# Patient Record
Sex: Male | Born: 1950 | Race: White | Hispanic: No | Marital: Married | Smoking: Former smoker
Health system: Southern US, Community
[De-identification: ages and names within clinical notes are randomized; demographics above are authoritative.]

## PROBLEM LIST (undated history)

## (undated) DIAGNOSIS — E785 Hyperlipidemia, unspecified: Secondary | ICD-10-CM

## (undated) DIAGNOSIS — I219 Acute myocardial infarction, unspecified: Secondary | ICD-10-CM

## (undated) DIAGNOSIS — IMO0002 Reserved for concepts with insufficient information to code with codable children: Secondary | ICD-10-CM

## (undated) DIAGNOSIS — I10 Essential (primary) hypertension: Secondary | ICD-10-CM

## (undated) DIAGNOSIS — E119 Type 2 diabetes mellitus without complications: Secondary | ICD-10-CM

## (undated) DIAGNOSIS — K219 Gastro-esophageal reflux disease without esophagitis: Secondary | ICD-10-CM

## (undated) DIAGNOSIS — E11319 Type 2 diabetes mellitus with unspecified diabetic retinopathy without macular edema: Secondary | ICD-10-CM

## (undated) DIAGNOSIS — I2510 Atherosclerotic heart disease of native coronary artery without angina pectoris: Secondary | ICD-10-CM

## (undated) DIAGNOSIS — M4800 Spinal stenosis, site unspecified: Secondary | ICD-10-CM

## (undated) DIAGNOSIS — M1990 Unspecified osteoarthritis, unspecified site: Secondary | ICD-10-CM

## (undated) HISTORY — PX: CORONARY ARTERY BYPASS GRAFT: SHX141

## (undated) HISTORY — PX: CERVICAL SPINE SURGERY: SHX589

---

## 2000-07-08 ENCOUNTER — Encounter: Payer: Self-pay | Admitting: Neurosurgery

## 2000-07-09 ENCOUNTER — Inpatient Hospital Stay (HOSPITAL_COMMUNITY): Admission: RE | Admit: 2000-07-09 | Discharge: 2000-07-10 | Payer: Self-pay | Admitting: Neurosurgery

## 2000-07-09 ENCOUNTER — Encounter: Payer: Self-pay | Admitting: Neurosurgery

## 2011-12-15 HISTORY — PX: INGUINAL HERNIA REPAIR: SUR1180

## 2013-08-25 ENCOUNTER — Other Ambulatory Visit: Payer: Self-pay | Admitting: Neurosurgery

## 2013-09-07 ENCOUNTER — Encounter (HOSPITAL_COMMUNITY): Payer: Self-pay | Admitting: Pharmacy Technician

## 2013-09-09 NOTE — Pre-Procedure Instructions (Signed)
Christian Edwards  09/09/2013   Your procedure is scheduled on:  October 3  Report to [facility] Entrance "A" [address] at [employer] AM.  Call this number if you have problems the morning of surgery: [phone]   Remember:   Do not eat food or drink liquids after midnight.   Take these medicines the morning of surgery with A SIP OF WATER: Atenolol, Wellbutrin, Nexium, Isosorbide, Tramadol (if needed)   STOP Naproxen today  Do not take Aspirin, Aleve, Naproxen, Advil, Ibuprofen, Vitamin, Herbs, or Supplements starting today  Do not wear jewelry, make-up or nail polish.  Do not wear lotions, powders, or perfumes. You may wear deodorant.  Do not shave 48 hours prior to surgery. Men may shave face and neck.  Do not bring valuables to the hospital.  [facility] is not responsible                  for any belongings or valuables.               Contacts, dentures or bridgework may not be worn into surgery.  Leave suitcase in the car. After surgery it may be brought to your room.  For patients admitted to the hospital, discharge time is determined by your                treatment team.               Patients discharged the day of surgery will not be allowed to drive  home.  Name and phone number of your driver: Family/ Friend  Special Instructions: Shower using CHG 2 nights before surgery and the night before surgery.  If you shower the day of surgery use CHG.  Use special wash - you have one bottle of CHG for all showers.  You should use approximately 1/3 of the bottle for each shower.   Please read over the following fact sheets that you were given: Pain Booklet, Coughing and Deep Breathing and Surgical Site Infection Prevention

## 2013-09-11 ENCOUNTER — Encounter (HOSPITAL_COMMUNITY): Payer: Self-pay

## 2013-09-11 ENCOUNTER — Ambulatory Visit (HOSPITAL_COMMUNITY)
Admission: RE | Admit: 2013-09-11 | Discharge: 2013-09-11 | Disposition: A | Discharge status: Home / Self Care | Payer: No Typology Code available for payment source | Source: Ambulatory Visit | Attending: Anesthesiology | Admitting: Anesthesiology

## 2013-09-11 ENCOUNTER — Encounter (HOSPITAL_COMMUNITY)
Admission: RE | Admit: 2013-09-11 | Discharge: 2013-09-11 | Disposition: A | Discharge status: Home / Self Care | Payer: No Typology Code available for payment source | Source: Ambulatory Visit | Attending: Neurosurgery | Admitting: Neurosurgery

## 2013-09-11 DIAGNOSIS — Z01818 Encounter for other preprocedural examination: Secondary | ICD-10-CM | POA: Insufficient documentation

## 2013-09-11 DIAGNOSIS — Z01812 Encounter for preprocedural laboratory examination: Secondary | ICD-10-CM | POA: Insufficient documentation

## 2013-09-11 HISTORY — DX: Unspecified osteoarthritis, unspecified site: M19.90

## 2013-09-11 HISTORY — DX: Spinal stenosis, site unspecified: M48.00

## 2013-09-11 HISTORY — DX: Essential (primary) hypertension: I10

## 2013-09-11 HISTORY — DX: Acute myocardial infarction, unspecified: I21.9

## 2013-09-11 HISTORY — DX: Hyperlipidemia, unspecified: E78.5

## 2013-09-11 HISTORY — DX: Atherosclerotic heart disease of native coronary artery without angina pectoris: I25.10

## 2013-09-11 HISTORY — DX: Gastro-esophageal reflux disease without esophagitis: K21.9

## 2013-09-11 HISTORY — DX: Type 2 diabetes mellitus without complications: E11.9

## 2013-09-11 HISTORY — DX: Reserved for concepts with insufficient information to code with codable children: IMO0002

## 2013-09-11 HISTORY — DX: Type 2 diabetes mellitus with unspecified diabetic retinopathy without macular edema: E11.319

## 2013-09-11 LAB — BASIC METABOLIC PANEL
CO2: 28 mEq/L (ref 19–32)
Calcium: 10.1 mg/dL (ref 8.4–10.5)
Creatinine, Ser: 1.11 mg/dL (ref 0.50–1.35)
GFR calc non Af Amer: 70 mL/min — ABNORMAL LOW (ref >90)
Glucose, Bld: 154 mg/dL — ABNORMAL HIGH (ref 70–99)
Sodium: 137 mEq/L (ref 135–145)

## 2013-09-11 LAB — CBC
MCH: 31.9 pg (ref 26.0–34.0)
MCV: 88.5 fL (ref 78.0–100.0)
Platelets: 251 10*3/uL (ref 150–400)
RBC: 4.33 MIL/uL (ref 4.22–5.81)
RDW: 13.1 % (ref 11.5–15.5)
WBC: 11.5 10*3/uL — ABNORMAL HIGH (ref 4.0–10.5)

## 2013-09-11 LAB — SURGICAL PCR SCREEN
MRSA, PCR: NEGATIVE
Staphylococcus aureus: NEGATIVE

## 2013-09-11 NOTE — Progress Notes (Signed)
09/11/13 0826  OBSTRUCTIVE SLEEP APNEA  Have you ever been diagnosed with sleep apnea through a sleep study? No  Do you snore loudly (loud enough to be heard through closed doors)?  1  Do you often feel tired, fatigued, or sleepy during the daytime? 1  Has anyone observed you stop breathing during your sleep? 0  Do you have, or are you being treated for high blood pressure? 1  BMI more than 35 kg/m2? 0  Age over 62 years old? 1  Neck circumference greater than 40 cm/18 inches? 0  Gender: 1  Obstructive Sleep Apnea Score 5  Score 4 or greater  Results sent to PCP

## 2013-09-12 NOTE — Progress Notes (Signed)
Anesthesia Chart Review:  Patient is a 62 year old male scheduled for left L2-3 laminectomy on 09/15/13 by Dr. Wynetta Emery. 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 OSA screening score was 5. Cardiologist is Dr. Chales Abrahams with [facility].  Patient was asymptomatic with a normal EKG, so he was cleared for this procedure from a cardiac standpoint.  He has not had any recent cardiac studies within the past three years at [facility] or [facility].     EKG on 08/15/13 showed NSR, consider old anterior infarct.   Preoperative labs and CXR noted.  Patient has been cleared by cardiology.  Preoperative testing appears acceptable.  Anticipate that he can proceed as planned.  Velna Ochs [facility] Short Stay Center/Anesthesiology Phone [phone] 09/12/2013 10:41 AM

## 2013-09-14 MED ORDER — CEFAZOLIN SODIUM-DEXTROSE 2-3 GM-% IV SOLR
2.0000 g | INTRAVENOUS | Status: AC
  Administered 2013-09-15: 2 g via INTRAVENOUS
  Filled 2013-09-14: qty 50

## 2013-09-14 MED ORDER — DEXAMETHASONE SODIUM PHOSPHATE 10 MG/ML IJ SOLN
10.0000 mg | INTRAMUSCULAR | Status: AC
  Administered 2013-09-15: 10 mg via INTRAVENOUS
  Filled 2013-09-14: qty 1

## 2013-09-15 ENCOUNTER — Encounter (HOSPITAL_COMMUNITY)
Admission: RE | Disposition: A | Discharge status: Home / Self Care | Payer: Self-pay | Source: Ambulatory Visit | Attending: Neurosurgery

## 2013-09-15 ENCOUNTER — Encounter (HOSPITAL_COMMUNITY): Payer: Self-pay | Admitting: Surgery

## 2013-09-15 ENCOUNTER — Observation Stay (HOSPITAL_COMMUNITY)
Admission: RE | Admit: 2013-09-15 | Discharge: 2013-09-15 | Disposition: A | Discharge status: Home / Self Care | Payer: No Typology Code available for payment source | Source: Ambulatory Visit | Attending: Neurosurgery | Admitting: Neurosurgery

## 2013-09-15 ENCOUNTER — Encounter (HOSPITAL_COMMUNITY): Payer: Self-pay | Admitting: Vascular Surgery

## 2013-09-15 ENCOUNTER — Inpatient Hospital Stay (HOSPITAL_COMMUNITY): Payer: No Typology Code available for payment source | Admitting: Certified Registered"

## 2013-09-15 ENCOUNTER — Observation Stay (HOSPITAL_COMMUNITY): Payer: No Typology Code available for payment source

## 2013-09-15 DIAGNOSIS — I2510 Atherosclerotic heart disease of native coronary artery without angina pectoris: Secondary | ICD-10-CM | POA: Insufficient documentation

## 2013-09-15 DIAGNOSIS — E785 Hyperlipidemia, unspecified: Secondary | ICD-10-CM | POA: Insufficient documentation

## 2013-09-15 DIAGNOSIS — I10 Essential (primary) hypertension: Secondary | ICD-10-CM | POA: Insufficient documentation

## 2013-09-15 DIAGNOSIS — M5126 Other intervertebral disc displacement, lumbar region: Principal | ICD-10-CM | POA: Insufficient documentation

## 2013-09-15 DIAGNOSIS — K219 Gastro-esophageal reflux disease without esophagitis: Secondary | ICD-10-CM | POA: Insufficient documentation

## 2013-09-15 DIAGNOSIS — E1139 Type 2 diabetes mellitus with other diabetic ophthalmic complication: Secondary | ICD-10-CM | POA: Insufficient documentation

## 2013-09-15 DIAGNOSIS — E11319 Type 2 diabetes mellitus with unspecified diabetic retinopathy without macular edema: Secondary | ICD-10-CM | POA: Insufficient documentation

## 2013-09-15 DIAGNOSIS — I252 Old myocardial infarction: Secondary | ICD-10-CM | POA: Insufficient documentation

## 2013-09-15 HISTORY — PX: LUMBAR LAMINECTOMY/DECOMPRESSION MICRODISCECTOMY: SHX5026

## 2013-09-15 LAB — GLUCOSE, CAPILLARY
Glucose-Capillary: 69 mg/dL — ABNORMAL LOW (ref 70–99)
Glucose-Capillary: 135 mg/dL — ABNORMAL HIGH (ref 70–99)

## 2013-09-15 SURGERY — LUMBAR LAMINECTOMY/DECOMPRESSION MICRODISCECTOMY 1 LEVEL
Anesthesia: General | Site: Back | Laterality: Left | Wound class: Clean

## 2013-09-15 MED ORDER — OXYCODONE-ACETAMINOPHEN 5-325 MG PO TABS
1.0000 | ORAL_TABLET | ORAL | Status: DC | PRN
  Administered 2013-09-15: 1 via ORAL
  Filled 2013-09-15: qty 1

## 2013-09-15 MED ORDER — NAPROXEN SODIUM 220 MG PO TABS: 220.0000 mg | ORAL_TABLET | Freq: Two times a day (BID) | ORAL | Status: DC | PRN

## 2013-09-15 MED ORDER — MENTHOL 3 MG MT LOZG: 1.0000 | LOZENGE | OROMUCOSAL | Status: DC | PRN

## 2013-09-15 MED ORDER — MEPERIDINE HCL 25 MG/ML IJ SOLN: 6.2500 mg | INTRAMUSCULAR | Status: DC | PRN

## 2013-09-15 MED ORDER — PHENOL 1.4 % MT LIQD: 1.0000 | OROMUCOSAL | Status: DC | PRN

## 2013-09-15 MED ORDER — SODIUM CHLORIDE 0.9 % IJ SOLN: 3.0000 mL | INTRAMUSCULAR | Status: DC | PRN

## 2013-09-15 MED ORDER — PHENYLEPHRINE HCL 10 MG/ML IJ SOLN
INTRAMUSCULAR | Status: DC | PRN
  Administered 2013-09-15: 40 ug via INTRAVENOUS

## 2013-09-15 MED ORDER — ROCURONIUM BROMIDE 100 MG/10ML IV SOLN
INTRAVENOUS | Status: DC | PRN
  Administered 2013-09-15: 50 mg via INTRAVENOUS

## 2013-09-15 MED ORDER — SODIUM CHLORIDE 0.9 % IJ SOLN: 3.0000 mL | Freq: Two times a day (BID) | INTRAMUSCULAR | Status: DC

## 2013-09-15 MED ORDER — 0.9 % SODIUM CHLORIDE (POUR BTL) OPTIME
TOPICAL | Status: DC | PRN
  Administered 2013-09-15: 1000 mL

## 2013-09-15 MED ORDER — LIDOCAINE-EPINEPHRINE 1 %-1:100000 IJ SOLN
INTRAMUSCULAR | Status: DC | PRN
  Administered 2013-09-15: 10 mL

## 2013-09-15 MED ORDER — ACETAMINOPHEN 325 MG PO TABS: 650.0000 mg | ORAL_TABLET | ORAL | Status: DC | PRN

## 2013-09-15 MED ORDER — ATORVASTATIN CALCIUM 10 MG PO TABS
10.0000 mg | ORAL_TABLET | Freq: Every day | ORAL | Status: DC
  Filled 2013-09-15: qty 1

## 2013-09-15 MED ORDER — ATENOLOL 50 MG PO TABS
50.0000 mg | ORAL_TABLET | Freq: Two times a day (BID) | ORAL | Status: DC
  Filled 2013-09-15: qty 1

## 2013-09-15 MED ORDER — DOCUSATE SODIUM 100 MG PO CAPS: 100.0000 mg | ORAL_CAPSULE | Freq: Two times a day (BID) | ORAL | Status: DC

## 2013-09-15 MED ORDER — BUPIVACAINE HCL (PF) 0.25 % IJ SOLN
INTRAMUSCULAR | Status: DC | PRN
  Administered 2013-09-15: 10 mL

## 2013-09-15 MED ORDER — HYDROMORPHONE HCL PF 1 MG/ML IJ SOLN: 0.2500 mg | INTRAMUSCULAR | Status: DC | PRN

## 2013-09-15 MED ORDER — THROMBIN 5000 UNITS EX SOLR
CUTANEOUS | Status: DC | PRN
  Administered 2013-09-15: 5000 [IU] via TOPICAL

## 2013-09-15 MED ORDER — OXYCODONE HCL 5 MG PO TABS: 5.0000 mg | ORAL_TABLET | Freq: Once | ORAL | Status: DC | PRN

## 2013-09-15 MED ORDER — TRAMADOL HCL 50 MG PO TABS: 50.0000 mg | ORAL_TABLET | Freq: Three times a day (TID) | ORAL | Status: DC | PRN

## 2013-09-15 MED ORDER — LIDOCAINE HCL (CARDIAC) 20 MG/ML IV SOLN
INTRAVENOUS | Status: DC | PRN
  Administered 2013-09-15: 80 mg via INTRAVENOUS

## 2013-09-15 MED ORDER — SODIUM CHLORIDE 0.9 % IR SOLN
Status: DC | PRN
  Administered 2013-09-15: 08:00:00

## 2013-09-15 MED ORDER — INFLUENZA VAC SPLIT QUAD 0.5 ML IM SUSP: 0.5000 mL | INTRAMUSCULAR | Status: DC

## 2013-09-15 MED ORDER — ALUM & MAG HYDROXIDE-SIMETH 200-200-20 MG/5ML PO SUSP: 30.0000 mL | Freq: Four times a day (QID) | ORAL | Status: DC | PRN

## 2013-09-15 MED ORDER — NAPROXEN 250 MG PO TABS
250.0000 mg | ORAL_TABLET | Freq: Two times a day (BID) | ORAL | Status: DC | PRN
  Filled 2013-09-15: qty 1

## 2013-09-15 MED ORDER — ONDANSETRON HCL 4 MG/2ML IJ SOLN: 4.0000 mg | INTRAMUSCULAR | Status: DC | PRN

## 2013-09-15 MED ORDER — OXYCODONE-ACETAMINOPHEN 5-325 MG PO TABS: 1.0000 | ORAL_TABLET | ORAL | Status: AC | PRN

## 2013-09-15 MED ORDER — INSULIN GLARGINE 100 UNIT/ML ~~LOC~~ SOLN
20.0000 [IU] | Freq: Every day | SUBCUTANEOUS | Status: DC
  Filled 2013-09-15: qty 0.2

## 2013-09-15 MED ORDER — ONDANSETRON HCL 4 MG/2ML IJ SOLN
INTRAMUSCULAR | Status: DC | PRN
  Administered 2013-09-15: 4 mg via INTRAVENOUS

## 2013-09-15 MED ORDER — DEXTROSE 50 % IV SOLN
INTRAVENOUS | Status: AC
  Administered 2013-09-15: 12.5 g via INTRAVENOUS
  Filled 2013-09-15: qty 50

## 2013-09-15 MED ORDER — NEOSTIGMINE METHYLSULFATE 1 MG/ML IJ SOLN
INTRAMUSCULAR | Status: DC | PRN
  Administered 2013-09-15: 1 mg via INTRAVENOUS
  Administered 2013-09-15: 3 mg via INTRAVENOUS

## 2013-09-15 MED ORDER — INSULIN GLARGINE 100 UNITS/ML SOLOSTAR PEN: 20.0000 [IU] | PEN_INJECTOR | Freq: Every day | SUBCUTANEOUS | Status: DC

## 2013-09-15 MED ORDER — OXYCODONE HCL 5 MG/5ML PO SOLN: 5.0000 mg | Freq: Once | ORAL | Status: DC | PRN

## 2013-09-15 MED ORDER — PROPOFOL 10 MG/ML IV BOLUS
INTRAVENOUS | Status: DC | PRN
  Administered 2013-09-15: 80 mg via INTRAVENOUS

## 2013-09-15 MED ORDER — FENTANYL CITRATE 0.05 MG/ML IJ SOLN
INTRAMUSCULAR | Status: DC | PRN
  Administered 2013-09-15: 250 ug via INTRAVENOUS

## 2013-09-15 MED ORDER — PROMETHAZINE HCL 25 MG/ML IJ SOLN: 6.2500 mg | INTRAMUSCULAR | Status: DC | PRN

## 2013-09-15 MED ORDER — LACTATED RINGERS IV SOLN
INTRAVENOUS | Status: DC | PRN
  Administered 2013-09-15: 07:00:00 via INTRAVENOUS

## 2013-09-15 MED ORDER — PANTOPRAZOLE SODIUM 40 MG PO TBEC: 40.0000 mg | DELAYED_RELEASE_TABLET | Freq: Every day | ORAL | Status: DC

## 2013-09-15 MED ORDER — ACETAMINOPHEN 650 MG RE SUPP: 650.0000 mg | RECTAL | Status: DC | PRN

## 2013-09-15 MED ORDER — LACTATED RINGERS IV SOLN
INTRAVENOUS | Status: DC
  Administered 2013-09-15: 07:00:00 via INTRAVENOUS

## 2013-09-15 MED ORDER — CEFAZOLIN SODIUM 1-5 GM-% IV SOLN
1.0000 g | Freq: Three times a day (TID) | INTRAVENOUS | Status: DC
  Filled 2013-09-15 (×2): qty 50

## 2013-09-15 MED ORDER — CYCLOBENZAPRINE HCL 10 MG PO TABS: 10.0000 mg | ORAL_TABLET | Freq: Three times a day (TID) | ORAL | Status: AC | PRN

## 2013-09-15 MED ORDER — MIDAZOLAM HCL 5 MG/5ML IJ SOLN
INTRAMUSCULAR | Status: DC | PRN
  Administered 2013-09-15: 1 mg via INTRAVENOUS

## 2013-09-15 MED ORDER — ISOSORBIDE MONONITRATE ER 60 MG PO TB24
120.0000 mg | ORAL_TABLET | Freq: Every day | ORAL | Status: DC
  Filled 2013-09-15: qty 2

## 2013-09-15 MED ORDER — GLYCOPYRROLATE 0.2 MG/ML IJ SOLN
INTRAMUSCULAR | Status: DC | PRN
  Administered 2013-09-15: 0.4 mg via INTRAVENOUS
  Administered 2013-09-15: 0.2 mg via INTRAVENOUS

## 2013-09-15 MED ORDER — HEMOSTATIC AGENTS (NO CHARGE) OPTIME
TOPICAL | Status: DC | PRN
  Administered 2013-09-15: 1 via TOPICAL

## 2013-09-15 MED ORDER — SODIUM CHLORIDE 0.9 % IV SOLN: 250.0000 mL | INTRAVENOUS | Status: DC

## 2013-09-15 MED ORDER — MIDAZOLAM HCL 2 MG/2ML IJ SOLN: 0.5000 mg | Freq: Once | INTRAMUSCULAR | Status: DC | PRN

## 2013-09-15 MED ORDER — BUPROPION HCL ER (XL) 150 MG PO TB24
150.0000 mg | ORAL_TABLET | Freq: Every day | ORAL | Status: DC
  Filled 2013-09-15: qty 1

## 2013-09-15 MED ORDER — HYDROMORPHONE HCL PF 1 MG/ML IJ SOLN: 0.5000 mg | INTRAMUSCULAR | Status: DC | PRN

## 2013-09-15 MED ORDER — LISINOPRIL 5 MG PO TABS
5.0000 mg | ORAL_TABLET | Freq: Every day | ORAL | Status: DC
  Filled 2013-09-15: qty 1

## 2013-09-15 MED ORDER — GABAPENTIN 300 MG PO CAPS
300.0000 mg | ORAL_CAPSULE | Freq: Every day | ORAL | Status: DC
  Filled 2013-09-15: qty 1

## 2013-09-15 MED ORDER — METFORMIN HCL 500 MG PO TABS
1000.0000 mg | ORAL_TABLET | Freq: Every day | ORAL | Status: DC
  Filled 2013-09-15: qty 2

## 2013-09-15 MED ORDER — CYCLOBENZAPRINE HCL 10 MG PO TABS: 10.0000 mg | ORAL_TABLET | Freq: Three times a day (TID) | ORAL | Status: DC | PRN

## 2013-09-15 SURGICAL SUPPLY — 60 items
ADH SKN CLS APL DERMABOND .7 (GAUZE/BANDAGES/DRESSINGS) ×1
ADH SKN CLS LQ APL DERMABOND (GAUZE/BANDAGES/DRESSINGS) ×1
APL SKNCLS STERI-STRIP NONHPOA (GAUZE/BANDAGES/DRESSINGS) ×1
BAG DECANTER FOR FLEXI CONT (MISCELLANEOUS) ×2 IMPLANT
BENZOIN TINCTURE PRP APPL 2/3 (GAUZE/BANDAGES/DRESSINGS) ×2 IMPLANT
BLADE SURG 11 STRL SS (BLADE) ×2 IMPLANT
BLADE SURG ROTATE 9660 (MISCELLANEOUS) IMPLANT
BRUSH SCRUB EZ PLAIN DRY (MISCELLANEOUS) ×2 IMPLANT
BUR MATCHSTICK NEURO 3.0 LAGG (BURR) ×2 IMPLANT
BUR PRECISION FLUTE 6.0 (BURR) ×2 IMPLANT
CANISTER SUCTION 2500CC (MISCELLANEOUS) ×2 IMPLANT
CLOTH BEACON ORANGE TIMEOUT ST (SAFETY) ×2 IMPLANT
CONT SPEC 4OZ CLIKSEAL STRL BL (MISCELLANEOUS) ×2 IMPLANT
DECANTER SPIKE VIAL GLASS SM (MISCELLANEOUS) ×2 IMPLANT
DERMABOND ADHESIVE PROPEN (GAUZE/BANDAGES/DRESSINGS) ×1
DERMABOND ADVANCED (GAUZE/BANDAGES/DRESSINGS) ×1
DERMABOND ADVANCED .7 DNX12 (GAUZE/BANDAGES/DRESSINGS) ×1 IMPLANT
DERMABOND ADVANCED .7 DNX6 (GAUZE/BANDAGES/DRESSINGS) IMPLANT
DRAPE LAPAROTOMY 100X72X124 (DRAPES) ×2 IMPLANT
DRAPE MICROSCOPE LEICA (MISCELLANEOUS) ×1 IMPLANT
DRAPE POUCH INSTRU U-SHP 10X18 (DRAPES) ×2 IMPLANT
DRAPE PROXIMA HALF (DRAPES) ×3 IMPLANT
DRAPE SURG 17X23 STRL (DRAPES) ×2 IMPLANT
DRSG OPSITE 4X5.5 SM (GAUZE/BANDAGES/DRESSINGS) ×2 IMPLANT
DRSG OPSITE POSTOP 3X4 (GAUZE/BANDAGES/DRESSINGS) ×1 IMPLANT
DURAPREP 26ML APPLICATOR (WOUND CARE) ×2 IMPLANT
ELECT REM PT RETURN 9FT ADLT (ELECTROSURGICAL) ×2
ELECTRODE REM PT RTRN 9FT ADLT (ELECTROSURGICAL) ×1 IMPLANT
GAUZE SPONGE 4X4 16PLY XRAY LF (GAUZE/BANDAGES/DRESSINGS) IMPLANT
GLOVE BIO SURGEON STRL SZ8 (GLOVE) ×2 IMPLANT
GLOVE ECLIPSE 8.5 STRL (GLOVE) ×1 IMPLANT
GLOVE EXAM NITRILE LRG STRL (GLOVE) ×1 IMPLANT
GLOVE EXAM NITRILE MD LF STRL (GLOVE) IMPLANT
GLOVE EXAM NITRILE XL STR (GLOVE) IMPLANT
GLOVE EXAM NITRILE XS STR PU (GLOVE) IMPLANT
GLOVE INDICATOR 7.5 STRL GRN (GLOVE) ×1 IMPLANT
GLOVE INDICATOR 8.5 STRL (GLOVE) ×2 IMPLANT
GLOVE SURG SS PI 7.0 STRL IVOR (GLOVE) ×3 IMPLANT
GOWN BRE IMP SLV AUR LG STRL (GOWN DISPOSABLE) ×1 IMPLANT
GOWN BRE IMP SLV AUR XL STRL (GOWN DISPOSABLE) ×5 IMPLANT
GOWN STRL REIN 2XL LVL4 (GOWN DISPOSABLE) IMPLANT
KIT BASIN OR (CUSTOM PROCEDURE TRAY) ×2 IMPLANT
KIT ROOM TURNOVER OR (KITS) ×2 IMPLANT
NDL SPNL 22GX3.5 QUINCKE BK (NEEDLE) ×1 IMPLANT
NEEDLE HYPO 22GX1.5 SAFETY (NEEDLE) ×2 IMPLANT
NEEDLE SPNL 22GX3.5 QUINCKE BK (NEEDLE) ×2 IMPLANT
NS IRRIG 1000ML POUR BTL (IV SOLUTION) ×2 IMPLANT
PACK LAMINECTOMY NEURO (CUSTOM PROCEDURE TRAY) ×2 IMPLANT
RUBBERBAND STERILE (MISCELLANEOUS) ×4 IMPLANT
SPONGE GAUZE 4X4 12PLY (GAUZE/BANDAGES/DRESSINGS) ×2 IMPLANT
SPONGE SURGIFOAM ABS GEL SZ50 (HEMOSTASIS) ×2 IMPLANT
STRIP CLOSURE SKIN 1/2X4 (GAUZE/BANDAGES/DRESSINGS) ×2 IMPLANT
SUT VIC AB 0 CT1 18XCR BRD8 (SUTURE) ×1 IMPLANT
SUT VIC AB 0 CT1 8-18 (SUTURE) ×2
SUT VIC AB 2-0 CT1 18 (SUTURE) ×2 IMPLANT
SUT VICRYL 4-0 PS2 18IN ABS (SUTURE) ×2 IMPLANT
SYR 20ML ECCENTRIC (SYRINGE) ×2 IMPLANT
TOWEL OR 17X24 6PK STRL BLUE (TOWEL DISPOSABLE) ×2 IMPLANT
TOWEL OR 17X26 10 PK STRL BLUE (TOWEL DISPOSABLE) ×2 IMPLANT
WATER STERILE IRR 1000ML POUR (IV SOLUTION) ×2 IMPLANT

## 2013-09-15 NOTE — Preoperative (Signed)
Beta Blockers   Reason not to administer Beta Blockers:Not Applicable 

## 2013-09-15 NOTE — Anesthesia Procedure Notes (Signed)
Procedure Name: Intubation Date/Time: 09/15/2013 7:36 AM Performed by: Ellin Goodie Pre-anesthesia Checklist: Patient identified, Emergency Drugs available, Suction available, Patient being monitored and Timeout performed Patient Re-evaluated:Patient Re-evaluated prior to inductionOxygen Delivery Method: Circle system utilized Preoxygenation: Pre-oxygenation with 100% oxygen Intubation Type: IV induction Ventilation: Mask ventilation without difficulty Laryngoscope Size: Mac and 3 Grade View: Grade I Tube type: Oral Tube size: 7.5 mm Number of attempts: 1 Airway Equipment and Method: Stylet Placement Confirmation: ETT inserted through vocal cords under direct vision,  positive ETCO2 and breath sounds checked- equal and bilateral Secured at: 23 cm Tube secured with: Tape Dental Injury: Teeth and Oropharynx as per pre-operative assessment  Comments: Easy atraumatic induction and intubation with MAC 3 blade.  Dr. Jean Rosenthal verified placement of ETT.  Carlynn Herald, CRNA

## 2013-09-15 NOTE — Anesthesia Postprocedure Evaluation (Signed)
  Anesthesia Post-op Note  Patient: Christian Edwards  Procedure(s) Performed: Procedure(s): LEFT LUMBAR TWO THREE LUMBAR LAMINECTOMY/DECOMPRESSION MICRODISCECTOMY 1 LEVEL (Left)  Patient Location: PACU  Anesthesia Type:General  Level of Consciousness: awake, alert , oriented and patient cooperative  Airway and Oxygen Therapy: Patient Spontanous Breathing and Patient connected to nasal cannula oxygen  Post-op Pain: mild  Post-op Assessment: Post-op Vital signs reviewed, Patient's Cardiovascular Status Stable, Respiratory Function Stable, Patent Airway, No signs of Nausea or vomiting and Pain level controlled  Post-op Vital Signs: Reviewed and stable  Complications: No apparent anesthesia complications

## 2013-09-15 NOTE — Progress Notes (Signed)
Dr. Michelle Piper called and informed that patient blood glucose was 69 and that he took 1000 mg of Metformin at 0400 and 22 units of Lantus at 2100. Dr. Michelle Piper ordered for Nurse to start IV and if he becomes symptomatic then dextrose could be given.

## 2013-09-15 NOTE — Progress Notes (Signed)
Pt doing well. Pt given D/C instructions with Rx's, verbal understanding was given. Pt D/C'd home via wheelchair@ 1800 per MD order. Rema Fendt, RN

## 2013-09-15 NOTE — Discharge Summary (Signed)
  Physician Discharge Summary  Patient ID: Christian Edwards MRN: [id number] DOB/AGE: [birth year] 62 y.o.  Admit date: 09/15/2013 Discharge date: 09/15/2013  Admission Diagnoses: Herniated nucleus pulposus L2-3 left  Discharge Diagnoses: Same Active Problems:   * No active hospital problems. *   Discharged Condition: good  Hospital Course: Patient in the hospital underwent a L2-3 laminectomy discectomy postoperatively she did very well went to the recover and then the floor on the floor he was angling and voiding spontaneously tolerating regular diet was stable and be discharged home scheduled followup approximately 1-2 weeks.  Consults: Significant Diagnostic Studies: Treatments: L2-3 laminectomy microdiscectomy Discharge Exam: Blood pressure 128/78, pulse 71, temperature 97.5 F (36.4 C), temperature source Oral, resp. rate 18, SpO2 93.00%. Strength5 out of 5 wound clean and dry  Disposition: Home     Medication List         atenolol 50 MG tablet  Commonly known as:  TENORMIN  Take 50 mg by mouth 2 (two) times daily.     buPROPion 150 MG 24 hr tablet  Commonly known as:  WELLBUTRIN XL  Take 150 mg by mouth daily.     cyclobenzaprine 10 MG tablet  Commonly known as:  FLEXERIL  Take 1 tablet (10 mg total) by mouth 3 (three) times daily as needed for muscle spasms.     esomeprazole 40 MG capsule  Commonly known as:  NEXIUM  Take 40 mg by mouth daily before breakfast.     gabapentin 300 MG capsule  Commonly known as:  NEURONTIN  Take 300 mg by mouth at bedtime.     insulin glargine 100 units/mL Soln  Commonly known as:  LANTUS  Inject 20 Units into the skin at bedtime.     isosorbide mononitrate 120 MG 24 hr tablet  Commonly known as:  IMDUR  Take 120 mg by mouth daily.     lisinopril 5 MG tablet  Commonly known as:  PRINIVIL,ZESTRIL  Take 5 mg by mouth daily.     metFORMIN 1000 MG tablet  Commonly known as:  GLUCOPHAGE  Take 1,000 mg by mouth daily with  breakfast.     naproxen sodium 220 MG tablet  Commonly known as:  ANAPROX  Take 220 mg by mouth 2 (two) times daily as needed (pain).     oxyCODONE-acetaminophen 5-325 MG per tablet  Commonly known as:  PERCOCET/ROXICET  Take 1-2 tablets by mouth every 4 (four) hours as needed.     rosuvastatin 20 MG tablet  Commonly known as:  CRESTOR  Take 20 mg by mouth daily.     traMADol 50 MG tablet  Commonly known as:  ULTRAM  Take 50 mg by mouth every 8 (eight) hours as needed for pain.           Follow-up Information   Follow up with [facility] P, MD.   Specialty:  Neurosurgery   Contact information:   [address] [place] [address] [phone]       Signed: Savio Albrecht P 09/15/2013, 3:37 PM

## 2013-09-15 NOTE — H&P (Signed)
Christian Edwards is an 62 y.o. male.   Chief Complaint: Back and left leg pain HPI: Patient is a very pleasant 62 year old gentleman is a progress worsening back pain that initially began rating down his right leg through his hip and his anterior quad consistent with a tibia and L3 nerve root pattern however this pain resolved and start exporting over to his left side involving his left hip in her groin and inner thigh as well as anterior thigh. The refractory to all forms of conservative treatment imaging revealed a very large disc herniation at L2-3 with a large free fragment migrating caudally behind the L3 vertebral body displacing the L3 nerve against the pedicle. In addition below that at L3-4 he is a far lateral extraforaminal disc herniation/disc bulge displacing the right L3 nerve. However the right L3 symptoms have completely resolved so therefore I do not think this necessarily has to be operated on. However the left leg persistent symptoms have persisted and this is a large fragment is been refractory to all forms of conservative treatment and his clinical exam continues to progress. Due to this and is imaging findings have recommended an L2-3 laminectomy and discectomy from the left. I extensively reviewed the risks and benefits of the operation as well as perioperative course expectations about alternatives of surgery he understands and agrees to proceed forward.  Past Medical History  Diagnosis Date  . Coronary artery disease   . Myocardial infarction   . Hypertension   . Type 2 diabetes mellitus   . Diabetic retinopathy   . Hyperlipemia   . Degenerative disc disease   . Osteoarthritis   . Spinal stenosis   . GERD (gastroesophageal reflux disease)     Past Surgical History  Procedure Laterality Date  . Coronary artery bypass graft  21 years   . Cervical spine surgery    . Inguinal hernia repair  2013    History reviewed. No pertinent family history. Social History:  reports  that he has been smoking.  He does not have any smokeless tobacco history on file. He reports that he does not drink alcohol or use illicit drugs.  Allergies:  Allergies  Allergen Reactions  . Codeine Hives, Nausea And Vomiting and Rash    Medications Prior to Admission  Medication Sig Dispense Refill  . atenolol (TENORMIN) 50 MG tablet Take 50 mg by mouth 2 (two) times daily.      Marland Kitchen buPROPion (WELLBUTRIN XL) 150 MG 24 hr tablet Take 150 mg by mouth daily.      Marland Kitchen esomeprazole (NEXIUM) 40 MG capsule Take 40 mg by mouth daily before breakfast.      . insulin glargine (LANTUS) 100 units/mL SOLN Inject 20 Units into the skin at bedtime.      . isosorbide mononitrate (IMDUR) 120 MG 24 hr tablet Take 120 mg by mouth daily.      Marland Kitchen lisinopril (PRINIVIL,ZESTRIL) 5 MG tablet Take 5 mg by mouth daily.      . metFORMIN (GLUCOPHAGE) 1000 MG tablet Take 1,000 mg by mouth daily with breakfast.      . naproxen sodium (ANAPROX) 220 MG tablet Take 220 mg by mouth 2 (two) times daily as needed (pain).      . rosuvastatin (CRESTOR) 20 MG tablet Take 20 mg by mouth daily.      Marland Kitchen gabapentin (NEURONTIN) 300 MG capsule Take 300 mg by mouth at bedtime.      . traMADol (ULTRAM) 50 MG tablet Take 50  mg by mouth every 8 (eight) hours as needed for pain.        Results for orders placed during the hospital encounter of 09/15/13 (from the past 48 hour(s))  GLUCOSE, CAPILLARY     Status: Abnormal   Collection Time    09/15/13  6:21 AM      Result Value Range   Glucose-Capillary 69 (*) 70 - 99 mg/dL   Comment 1 Notify RN     No results found.  Review of Systems  Constitutional: Negative.   HENT: Negative.   Eyes: Negative.   Respiratory: Negative.   Cardiovascular: Negative.   Gastrointestinal: Negative.   Genitourinary: Negative.   Musculoskeletal: Positive for myalgias, back pain and joint pain.  Skin: Negative.   Neurological: Positive for tingling.  Endo/Heme/Allergies: Negative.    Psychiatric/Behavioral: Negative.     Blood pressure 122/77, pulse 62, temperature 97.5 F (36.4 C), temperature source Oral, resp. rate 20, SpO2 97.00%. Physical Exam  Constitutional: He is oriented to person, place, and time. He appears well-developed and well-nourished.  Eyes: Pupils are equal, round, and reactive to light.  Neck: Normal range of motion.  Cardiovascular: Normal rate.   Respiratory: Effort normal.  GI: Soft.  Musculoskeletal: Normal range of motion.  Neurological: He is alert and oriented to person, place, and time. He has normal strength. GCS eye subscore is 4. GCS verbal subscore is 5. GCS motor subscore is 6.  Reflex Scores:      Patellar reflexes are 0 on the right side and 0 on the left side.      Achilles reflexes are 0 on the right side and 0 on the left side. Strength is 5 out of 5 in his iliopsoas, quads, hamstrings, gastrocs, anterior tibialis, and EHL.     Assessment/Plan 62 year old and presents for an L2-3 left-sided laminectomy microdiscectomy  Tyreak Reagle P 09/15/2013, 7:25 AM

## 2013-09-15 NOTE — Op Note (Signed)
Preoperative diagnosis: Left side L3 radiculopathy from large herniated nucleus pulposus L2-3 left  Postoperative diagnosis: Same  Procedure: Lumbar laminectomy microscope L2-3 left with microdissection of the left L3 nerve root microscopic discectomy  Surgeon: Jillyn Hidden Louie Flenner  Assistant: Sherilyn Cooter pool  Anesthesia: Gen.  EBL: Minimal  History of present illness: Patient is a very pleasant 62 year old some is a progress worsening back and left leg pain rating down L3 nerve root pattern refractory to all forms of conservative treatment. Imaging revealed a large disc herniation with free fragment migrated caudally behind the L3 vertebral body displacing the L3 nerve against the L3 pedicle due to patient's failure conservative treatment imaging findings and progression of clinical syndrome I recommended laminectomy microdiscectomy L2-3 on the left I extensively reviewed the risks and benefits of the operation with the patient as well as perioperative course expectations of outcome alternatives surgery and he he understood and agreed to proceed forward.  Operative procedure: Patient brought into the or was induced under general anesthesia positioned prone the Wilson frame his back was prepped and draped in routine sterile fashion. Preoperative x-ray localize the appropriate levels after infiltration 10 cc lidocaine with epi a midline incision was made and Bovie light cautery was used to take down the subcutaneous tissue and subperiosteal dissections care lamina of L2 and L3. Interoperative X. to confirm the appropriate level so the inferior aspect of the lamina of L2 medial facet complexes suppressant of the lamina of L3 was drilled down a high-speed drill laminotomy was begun with a 2 and 3 mm Kerrison punch. The ligament flavum was identified and removed in piecemeal fashion exposing the thecal sac. Then working laterally the undersurface the medial facet was bitten away to identify the L3 pedicle and laminotomy  was carried superiorly to the level just above the disc space and inferiorly to the level just below the L3 pedicle. Then the L3 nerve root was identified and it was dissected carefully off the L3 pedicle. This was densely adherent to the ligament still partially contained a large disc herniation that migrated inferiorly from the disc space the disc space was then incised and initially anteroposterior rongeurs and then working [place] several large broken fragments of free fragments removed from the inferior inferior posterior L3 vertebral body medial to the pedicle space. At the discectomy there is no further stenosis and no further disc herniation or disc bulges from within the disc space or inferiorly the L3 nerve root was easily mobilized I excepting a coronary.and hockey-stick out the foramen with these. The wounds and to proceed her get meticulous in a stasis was maintained Gelfoam was laid up the dura the muscle fascia approximately with interrupted Vicryl the skin was closed running 4 subcuticular benzo and Steri-Strips were applied patient recovered in stable condition. At the end of case on it counts sponge counts were correct.

## 2013-09-15 NOTE — Anesthesia Preprocedure Evaluation (Addendum)
Anesthesia Evaluation  Patient identified by MRN, date of birth, ID band Patient awake    Reviewed: Allergy & Precautions, H&P , NPO status , Patient's Chart, lab work & pertinent test results, reviewed documented beta blocker date and time   History of Anesthesia Complications Negative for: history of anesthetic complications  Airway Mallampati: I TM Distance: >3 FB     Dental  (+) Teeth Intact, Dental Advisory Given and Missing   Pulmonary neg pulmonary ROS, Current Smoker,  breath sounds clear to auscultation  Pulmonary exam normal       Cardiovascular hypertension, Pt. on medications and Pt. on home beta blockers - angina+ CAD, + Past MI, + Cardiac Stents and + CABG Rhythm:Regular Rate:Normal  Patient s/p CABG for 2 vessel disease, subsequent stent, then 1 SVG failure, no chest pain   Neuro/Psych Chronic back pain: tramadol negative neurological ROS  negative psych ROS   GI/Hepatic Neg liver ROS, GERD-  Controlled,  Endo/Other  diabetes (glu 61), Well Controlled, Type 2, Insulin Dependent and Oral Hypoglycemic Agents  Renal/GU negative Renal ROS     Musculoskeletal   Abdominal (+)  Abdomen: soft. Bowel sounds: normal.  Peds  Hematology   Anesthesia Other Findings MI approx 20 years ago.  S/P CABG, pt takes aspirin daily.    Reproductive/Obstetrics                        Anesthesia Physical Anesthesia Plan  ASA: III  Anesthesia Plan: General   Post-op Pain Management:    Induction: Intravenous  Airway Management Planned: Oral ETT  Additional Equipment:   Intra-op Plan:   Post-operative Plan: Extubation in OR  Informed Consent: I have reviewed the patients History and Physical, chart, labs and discussed the procedure including the risks, benefits and alternatives for the proposed anesthesia with the patient or authorized representative who has indicated his/her understanding and  acceptance.   Dental advisory given  Plan Discussed with: CRNA and Surgeon  Anesthesia Plan Comments: (Plan routine monitors, GETA)        Anesthesia Quick Evaluation

## 2013-09-15 NOTE — Transfer of Care (Signed)
Immediate Anesthesia Transfer of Care Note  Patient: Christian Edwards  Procedure(s) Performed: Procedure(s): LEFT LUMBAR TWO THREE LUMBAR LAMINECTOMY/DECOMPRESSION MICRODISCECTOMY 1 LEVEL (Left)  Patient Location: PACU  Anesthesia Type:General  Level of Consciousness: awake, alert  and sedated  Airway & Oxygen Therapy: Patient connected to face mask oxygen  Post-op Assessment: Report given to PACU RN  Post vital signs: stable  Complications: No apparent anesthesia complications

## 2013-09-19 ENCOUNTER — Encounter (HOSPITAL_COMMUNITY): Payer: Self-pay | Admitting: Neurosurgery

## 2021-10-21 ENCOUNTER — Encounter (HOSPITAL_BASED_OUTPATIENT_CLINIC_OR_DEPARTMENT_OTHER): Payer: Self-pay | Admitting: Emergency Medicine

## 2021-10-21 ENCOUNTER — Other Ambulatory Visit: Payer: Self-pay

## 2021-10-21 ENCOUNTER — Emergency Department (HOSPITAL_BASED_OUTPATIENT_CLINIC_OR_DEPARTMENT_OTHER): Payer: Medicare Other

## 2021-10-21 ENCOUNTER — Emergency Department (HOSPITAL_BASED_OUTPATIENT_CLINIC_OR_DEPARTMENT_OTHER)
Admission: EM | Admit: 2021-10-21 | Discharge: 2021-10-21 | Disposition: A | Discharge status: Home / Self Care | Payer: Medicare Other | Attending: Emergency Medicine | Admitting: Emergency Medicine

## 2021-10-21 DIAGNOSIS — Z87891 Personal history of nicotine dependence: Secondary | ICD-10-CM | POA: Insufficient documentation

## 2021-10-21 DIAGNOSIS — I119 Hypertensive heart disease without heart failure: Secondary | ICD-10-CM | POA: Insufficient documentation

## 2021-10-21 DIAGNOSIS — Z794 Long term (current) use of insulin: Secondary | ICD-10-CM | POA: Diagnosis not present

## 2021-10-21 DIAGNOSIS — N201 Calculus of ureter: Secondary | ICD-10-CM | POA: Insufficient documentation

## 2021-10-21 DIAGNOSIS — E119 Type 2 diabetes mellitus without complications: Secondary | ICD-10-CM | POA: Insufficient documentation

## 2021-10-21 DIAGNOSIS — I2510 Atherosclerotic heart disease of native coronary artery without angina pectoris: Secondary | ICD-10-CM | POA: Diagnosis not present

## 2021-10-21 DIAGNOSIS — N23 Unspecified renal colic: Secondary | ICD-10-CM

## 2021-10-21 DIAGNOSIS — Z79899 Other long term (current) drug therapy: Secondary | ICD-10-CM | POA: Diagnosis not present

## 2021-10-21 DIAGNOSIS — Z951 Presence of aortocoronary bypass graft: Secondary | ICD-10-CM | POA: Diagnosis not present

## 2021-10-21 DIAGNOSIS — R319 Hematuria, unspecified: Secondary | ICD-10-CM | POA: Diagnosis not present

## 2021-10-21 DIAGNOSIS — R109 Unspecified abdominal pain: Secondary | ICD-10-CM | POA: Diagnosis present

## 2021-10-21 LAB — COMPREHENSIVE METABOLIC PANEL
ALT: 12 U/L (ref 0–44)
AST: 21 U/L (ref 15–41)
Albumin: 3.9 g/dL (ref 3.5–5.0)
Alkaline Phosphatase: 94 U/L (ref 38–126)
Anion gap: 11 (ref 5–15)
BUN: 29 mg/dL — ABNORMAL HIGH (ref 8–23)
CO2: 23 mmol/L (ref 22–32)
Calcium: 8.9 mg/dL (ref 8.9–10.3)
Chloride: 99 mmol/L (ref 98–111)
Creatinine, Ser: 2.89 mg/dL — ABNORMAL HIGH (ref 0.61–1.24)
GFR, Estimated: 23 mL/min — ABNORMAL LOW (ref >60)
Glucose, Bld: 264 mg/dL — ABNORMAL HIGH (ref 70–99)
Potassium: 3.5 mmol/L (ref 3.5–5.1)
Sodium: 133 mmol/L — ABNORMAL LOW (ref 135–145)
Total Bilirubin: 0.6 mg/dL (ref 0.3–1.2)
Total Protein: 7 g/dL (ref 6.5–8.1)

## 2021-10-21 LAB — URINALYSIS, MICROSCOPIC (REFLEX)

## 2021-10-21 LAB — LIPASE, BLOOD: Lipase: 71 U/L — ABNORMAL HIGH (ref 11–51)

## 2021-10-21 LAB — URINALYSIS, ROUTINE W REFLEX MICROSCOPIC
Bilirubin Urine: NEGATIVE
Glucose, UA: >=500 mg/dL — AB
Ketones, ur: NEGATIVE mg/dL
Leukocytes,Ua: NEGATIVE
Nitrite: NEGATIVE
Protein, ur: 30 mg/dL — AB
Specific Gravity, Urine: 1.015 (ref 1.005–1.030)
pH: 6 (ref 5.0–8.0)

## 2021-10-21 LAB — CBC WITH DIFFERENTIAL/PLATELET
Abs Immature Granulocytes: 0.03 10*3/uL (ref 0.00–0.07)
Basophils Absolute: 0.1 10*3/uL (ref 0.0–0.1)
Basophils Relative: 1 %
Eosinophils Absolute: 0.1 10*3/uL (ref 0.0–0.5)
Eosinophils Relative: 1 %
HCT: 35 % — ABNORMAL LOW (ref 39.0–52.0)
Hemoglobin: 12.6 g/dL — ABNORMAL LOW (ref 13.0–17.0)
Immature Granulocytes: 0 %
Lymphocytes Relative: 10 %
Lymphs Abs: 1 10*3/uL (ref 0.7–4.0)
MCH: 31.5 pg (ref 26.0–34.0)
MCHC: 36 g/dL (ref 30.0–36.0)
MCV: 87.5 fL (ref 80.0–100.0)
Monocytes Absolute: 0.6 10*3/uL (ref 0.1–1.0)
Monocytes Relative: 6 %
Neutro Abs: 8.3 10*3/uL — ABNORMAL HIGH (ref 1.7–7.7)
Neutrophils Relative %: 82 %
Platelets: 266 10*3/uL (ref 150–400)
RBC: 4 MIL/uL — ABNORMAL LOW (ref 4.22–5.81)
RDW: 12.2 % (ref 11.5–15.5)
WBC: 10.1 10*3/uL (ref 4.0–10.5)
nRBC: 0 % (ref 0.0–0.2)

## 2021-10-21 MED ORDER — ONDANSETRON HCL 4 MG/2ML IJ SOLN
4.0000 mg | Freq: Once | INTRAMUSCULAR | Status: AC
  Administered 2021-10-21: 4 mg via INTRAVENOUS
  Filled 2021-10-21: qty 2

## 2021-10-21 MED ORDER — KETOROLAC TROMETHAMINE 15 MG/ML IJ SOLN
15.0000 mg | Freq: Once | INTRAMUSCULAR | Status: AC
  Administered 2021-10-21: 15 mg via INTRAVENOUS
  Filled 2021-10-21: qty 1

## 2021-10-21 MED ORDER — LACTATED RINGERS IV BOLUS
1000.0000 mL | Freq: Once | INTRAVENOUS | Status: AC
  Administered 2021-10-21: 1000 mL via INTRAVENOUS

## 2021-10-21 NOTE — ED Provider Notes (Signed)
[facility] EMERGENCY DEPARTMENT Provider Note   CSN: [id number] Arrival date & time: 10/21/21  3151     History Chief Complaint  Patient presents with   Flank Pain    Christian Edwards is a 70 y.o. male.   Flank Pain Pertinent negatives include no chest pain, no abdominal pain and no shortness of breath.   70 year old male with a history of CAD, HTN, HLD, DM2, prior nephrolithiasis who presents to the emergency department with left-sided flank pain.  The patient states that he had sudden onset left-sided flank pain that has been intermittent over the past week but came on suddenly overnight.  He endorses 5 out of 10 pain in his left flank that is sharp, shooting, nonradiating with some associated nausea.  He states that it feels like his prior kidney stones.  Pain is aggravated by movement and alleviated by rest.  He denies any fevers or chills or other infectious symptoms.  No dysuria or hematuria.  Past Medical History:  Diagnosis Date   Coronary artery disease    Degenerative disc disease    Diabetic retinopathy (HCC)    GERD (gastroesophageal reflux disease)    Hyperlipemia    Hypertension    Myocardial infarction [facility])    Osteoarthritis    Spinal stenosis    Type 2 diabetes mellitus (HCC)     There are no problems to display for this patient.   Past Surgical History:  Procedure Laterality Date   CERVICAL SPINE SURGERY     CORONARY ARTERY BYPASS GRAFT  21 years    INGUINAL HERNIA REPAIR  2013   LUMBAR LAMINECTOMY/DECOMPRESSION MICRODISCECTOMY Left 09/15/2013   Procedure: LEFT LUMBAR TWO THREE LUMBAR LAMINECTOMY/DECOMPRESSION MICRODISCECTOMY 1 LEVEL;  Surgeon: Mariam Dollar, MD;  Location: [facility];  Service: Neurosurgery;  Laterality: Left;       History reviewed. No pertinent family history.  Social History   Tobacco Use   Smoking status: Former    Years: 20.00    Types: Cigarettes   Smokeless tobacco: Never   Tobacco comments:    amount  varies some days non  Vaping Use   Vaping Use: Never used  Substance Use Topics   Alcohol use: No   Drug use: No    Home Medications Prior to Admission medications   Medication Sig Start Date End Date Taking? Authorizing Provider  atenolol (TENORMIN) 50 MG tablet Take 50 mg by mouth 2 (two) times daily.    [provider]  buPROPion (WELLBUTRIN XL) 150 MG 24 hr tablet Take 150 mg by mouth daily.    [provider]  cyclobenzaprine (FLEXERIL) 10 MG tablet Take 1 tablet (10 mg total) by mouth 3 (three) times daily as needed for muscle spasms. 09/15/13   Donalee Citrin, MD  esomeprazole (NEXIUM) 40 MG capsule Take 40 mg by mouth daily before breakfast.    [provider]  gabapentin (NEURONTIN) 300 MG capsule Take 300 mg by mouth at bedtime.    [provider]  insulin glargine (LANTUS) 100 units/mL SOLN Inject 20 Units into the skin at bedtime.    [provider]  isosorbide mononitrate (IMDUR) 120 MG 24 hr tablet Take 120 mg by mouth daily.    [provider]  lisinopril (PRINIVIL,ZESTRIL) 5 MG tablet Take 5 mg by mouth daily.    [provider]  metFORMIN (GLUCOPHAGE) 1000 MG tablet Take 1,000 mg by mouth daily with breakfast.    [provider]  naproxen  sodium (ANAPROX) 220 MG tablet Take 220 mg by mouth 2 (two) times daily as needed (pain).    [provider]  oxyCODONE-acetaminophen (PERCOCET/ROXICET) 5-325 MG per tablet Take 1-2 tablets by mouth every 4 (four) hours as needed. 09/15/13   Kary Kos, MD  rosuvastatin (CRESTOR) 20 MG tablet Take 20 mg by mouth daily.    [provider]  traMADol (ULTRAM) 50 MG tablet Take 50 mg by mouth every 8 (eight) hours as needed for pain.    [provider]    Allergies    Codeine  Review of Systems   Review of Systems  Constitutional:  Negative for chills and fever.  HENT:  Negative for ear pain and sore throat.   Eyes:  Negative for pain and visual  disturbance.  Respiratory:  Negative for cough and shortness of breath.   Cardiovascular:  Negative for chest pain and palpitations.  Gastrointestinal:  Positive for nausea. Negative for abdominal pain and vomiting.  Genitourinary:  Positive for flank pain. Negative for dysuria and hematuria.  Musculoskeletal:  Negative for arthralgias and back pain.  Skin:  Negative for color change and rash.  Neurological:  Negative for seizures and syncope.  All other systems reviewed and are negative.  Physical Exam Updated Vital Signs BP 119/89 (BP Location: Left Arm)   Pulse 75   Temp 97.7 F (36.5 C) (Oral)   Resp 18   Ht 5\' 3"  (1.6 m)   Wt 70.8 kg   SpO2 96%   BMI 27.63 kg/m   Physical Exam Vitals and nursing note reviewed.  Constitutional:      General: He is not in acute distress.    Appearance: He is well-developed.  HENT:     Head: Normocephalic and atraumatic.  Eyes:     Conjunctiva/sclera: Conjunctivae normal.     Pupils: Pupils are equal, round, and reactive to light.  Cardiovascular:     Rate and Rhythm: Normal rate and regular rhythm.     Heart sounds: No murmur heard. Pulmonary:     Effort: Pulmonary effort is normal. No respiratory distress.     Breath sounds: Normal breath sounds.  Abdominal:     General: There is no distension.     Palpations: Abdomen is soft.     Tenderness: There is no abdominal tenderness. There is left CVA tenderness. There is no right CVA tenderness or guarding.  Musculoskeletal:        General: No deformity or signs of injury.     Cervical back: Normal range of motion and neck supple.  Skin:    General: Skin is warm and dry.     Findings: No lesion or rash.  Neurological:     General: No focal deficit present.     Mental Status: He is alert. Mental status is at baseline.    ED Results / Procedures / Treatments   Labs (all labs ordered are listed, but only abnormal results are displayed) Labs Reviewed  CBC WITH DIFFERENTIAL/PLATELET  - Abnormal; Notable for the following components:      Result Value   RBC 4.00 (*)    Hemoglobin 12.6 (*)    HCT 35.0 (*)    Neutro Abs 8.3 (*)    All other components within normal limits  COMPREHENSIVE METABOLIC PANEL - Abnormal; Notable for the following components:   Sodium 133 (*)    Glucose, Bld 264 (*)    BUN 29 (*)    Creatinine, Ser 2.89 (*)  GFR, Estimated 23 (*)    All other components within normal limits  LIPASE, BLOOD - Abnormal; Notable for the following components:   Lipase 71 (*)    All other components within normal limits  URINALYSIS, ROUTINE W REFLEX MICROSCOPIC - Abnormal; Notable for the following components:   Glucose, UA >=500 (*)    Hgb urine dipstick MODERATE (*)    Protein, ur 30 (*)    All other components within normal limits  URINALYSIS, MICROSCOPIC (REFLEX) - Abnormal; Notable for the following components:   Bacteria, UA RARE (*)    All other components within normal limits    EKG None  Radiology CT Renal Stone Study  Result Date: 10/21/2021 CLINICAL DATA:  Flank pain.  Renal stone. EXAM: CT ABDOMEN AND PELVIS WITHOUT CONTRAST TECHNIQUE: Multidetector CT imaging of the abdomen and pelvis was performed following the standard protocol without IV contrast. COMPARISON:  CT abdomen and pelvis, 06/22/2020. FINDINGS: Lower chest: Trace LEFT basilar paraseptal emphysematous lung changes. Median sternotomy, incompletely imaged. Severe burden of RCA atherosclerosis. Mild gynecomastia. Hepatobiliary: Normal noncontrast appearance without focal liver abnormality. No gallstones, gallbladder wall thickening, or biliary dilatation. Pancreas: Unremarkable. No pancreatic ductal dilatation or surrounding inflammatory changes. Spleen: Normal in size without focal abnormality. Adrenals/Urinary Tract: *Adrenal glands are unremarkable. *RIGHT kidney is normal, without renal calculi, focal lesion, or hydronephrosis. *Small-to-moderate volume LEFT hydronephrosis and  perinephric stranding. *Mild ureteral dilation with transition near the pelvic brim (see image). No radiodense calculus. *Bladder is unremarkable. Stomach/Bowel: Stomach is within normal limits. Inspissated radiodensity at the base of cecum and proximal appendix, likely ingested. Appendix appears normal. Moderate sigmoid diverticulosis. No evidence of bowel wall thickening, distention, or inflammatory changes. Vascular/Lymphatic: Severe burden of aortic atherosclerosis, without aneurysmal dilation. No enlarged abdominal or pelvic lymph nodes. Reproductive: Prostatomegaly, measuring up to 5.1 cm. Vasectomy. Small volume hydroceles. Other: No abdominal wall hernia or abnormality. No abdominopelvic ascites. Musculoskeletal: Multilevel degenerative changes of spine, predominantly at the thoracolumbar junction and mid lumbar spine. No acute osseous abnormality IMPRESSION: 1. Small to moderate volume LEFT hydronephrosis and perinephric stranding. 2. Ureteral dilation without radiodense calculus, suspect radiolucent nephrolith. Differential diagnosis includes LEFT pyelonephritis. Additional chronic and senescent changes, as above. Electronically Signed   By: Michaelle Birks M.D.   On: 10/21/2021 08:12    Procedures Procedures   Medications Ordered in ED Medications  ketorolac (TORADOL) 15 MG/ML injection 15 mg (15 mg Intravenous Given 10/21/21 0748)  lactated ringers bolus 1,000 mL (0 mLs Intravenous Stopped 10/21/21 0916)  ondansetron (ZOFRAN) injection 4 mg (4 mg Intravenous Given 10/21/21 0748)    ED Course  I have reviewed the triage vital signs and the nursing notes.  Pertinent labs & imaging results that were available during my care of the patient were reviewed by me and considered in my medical decision making (see chart for details).    MDM Rules/Calculators/A&P                           70 year old male with a history of CAD, HTN, HLD, DM2, prior nephrolithiasis who presents to the emergency  department with left-sided flank pain.  The patient states that he had sudden onset left-sided flank pain that has been intermittent over the past week but came on suddenly overnight.  He endorses 5 out of 10 pain in his left flank that is sharp, shooting, nonradiating with some associated nausea.  He states that it feels like his prior kidney  stones.  Pain is aggravated by movement and alleviated by rest.  He denies any fevers or chills or other infectious symptoms.  No dysuria or hematuria.  Differential diagnosis includes nephrolithiasis, ureterolithiasis, pyelonephritis, diverticulitis, UTI.  Work-up initiated to include screening labs, urinalysis, CT of the abdomen pelvis renal stone study.  Laboratory analysis significant for mildly elevated lipase of 71, elevated serum creatinine to 2.89 from baseline of around 2.8-3 based on recent laboratory work-up in Care Everywhere in September 2022.  Urinalysis with rare bacteria, normal WBCs, negative nitrites and leukocytes, moderate hemoglobin.  CBC without a leukocytosis, hemoglobin stable at 12.6.  CT renal stone study revealed small to moderate volume left hydronephrosis and perinephric stranding, ureteral dilatation without radiodense calculus, suspect radiolucent nephrolith, differential includes left pyelonephritis.  Given the lack of UTI evidence on urinalysis, feel pyelonephritis is less likely.  The patient symptoms resolved following an IV fluid bolus and IV Toradol.  I spoke with on-call [facility] who recommended outpatient follow-up  renal ultrasound.  Given the hematuria noted on urinalysis, evidence of ureteral dilatation and hydronephrosis, resolution of symptoms, favor likely passed kidney stone.  Return precautions were provided, overall stable for outpatient follow-up with urology.  Final Clinical Impression(s) / ED Diagnoses Final diagnoses:  Left flank pain  Ureteral colic  Hematuria, unspecified type    Rx / DC Orders ED  Discharge Orders          Ordered    Ambulatory referral to Urology        10/21/21 1052             Regan Lemming, MD 10/21/21 1052

## 2021-10-21 NOTE — ED Triage Notes (Signed)
Pt is c/o left flank/back pain that started about a week ago  Pt states the pain comes and goes  Pt has hx of kidney stones and his urologist states pieces, crystals, fleck off and cause the pain  Pt states he has been up all night with pain and nausea

## 2021-10-21 NOTE — Discharge Instructions (Addendum)
You likely have passed a kidney stone.   No evidence of acute urinary tract infection or kidney infection on urinalysis and your renal function appears to be at baseline.  I spoke with [facility] who recommended follow-up in clinic for repeat renal ultrasound and repeat labs to ensure your kidney function is improving.

## 2023-04-29 IMAGING — CT CT RENAL STONE PROTOCOL
2 of 4 series · 15 of 46 positions shown, 17 images · non-contrast
Comparison: CT abdomen and pelvis, 06/22/2020.

CLINICAL DATA: Flank pain.  Renal stone.

EXAM:
CT ABDOMEN AND PELVIS WITHOUT CONTRAST
TECHNIQUE: Multidetector CT imaging of the abdomen and pelvis was performed
following the standard protocol without IV contrast.

[Series 2: axial st · axial · 0.82mm/px · z∈[-528,-28]mm · 12 of 110 slices shown, 14 images]
[im 5/110  soft-tissue]
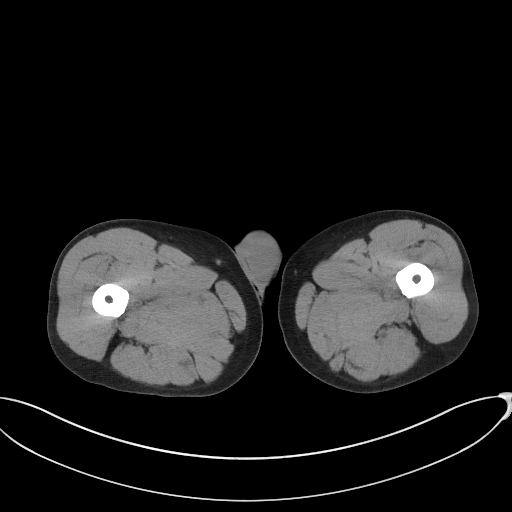
[im 5/110  bone]
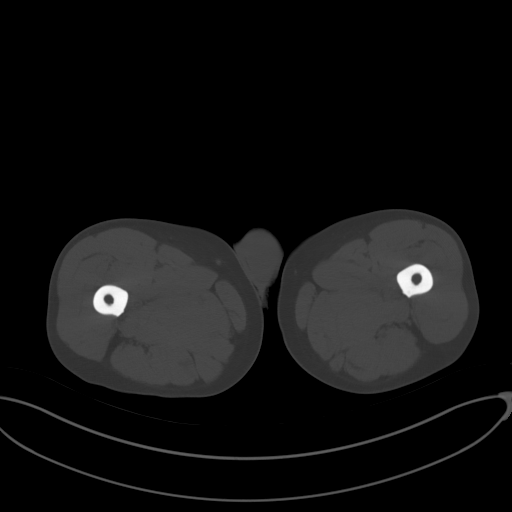
[im 14/110  soft-tissue]
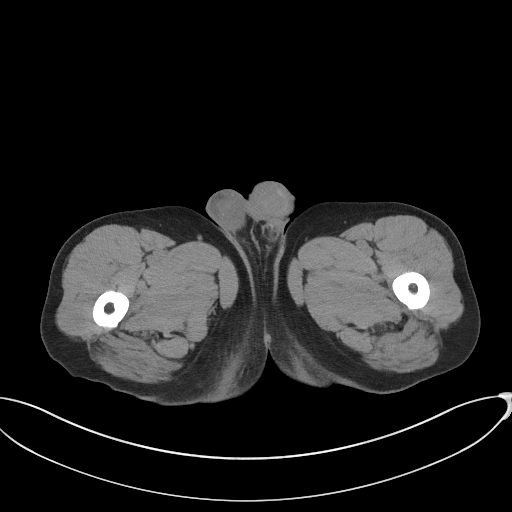
[im 23/110  soft-tissue]
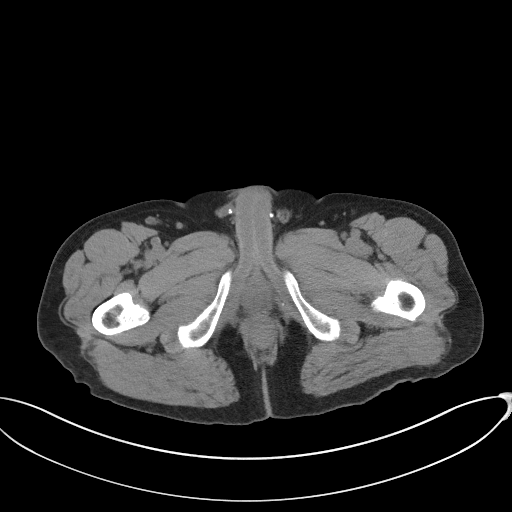
[im 32/110  soft-tissue]
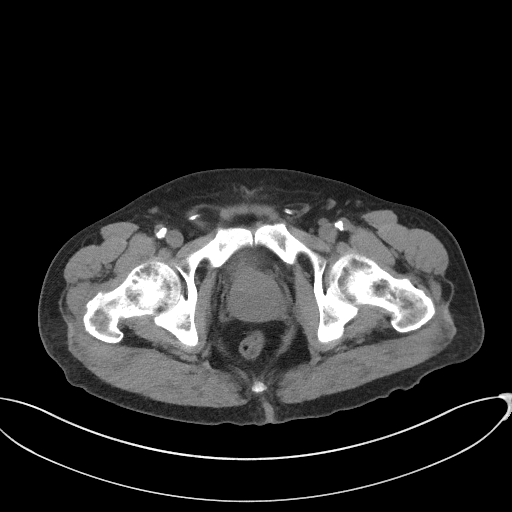
[im 41/110  soft-tissue]
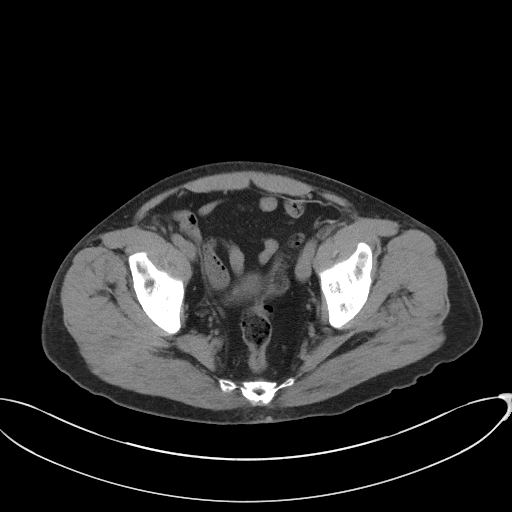
[im 50/110  soft-tissue]
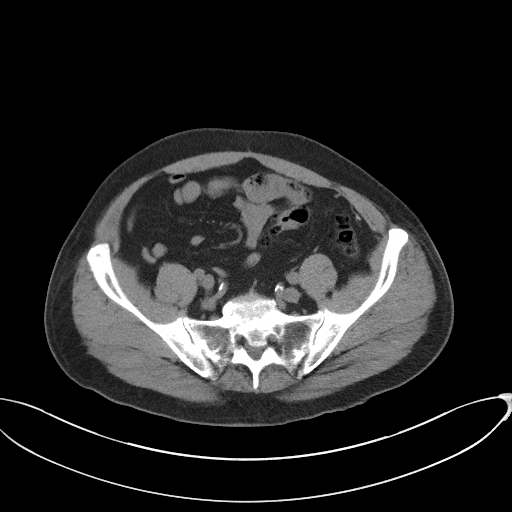
[im 60/110  soft-tissue]
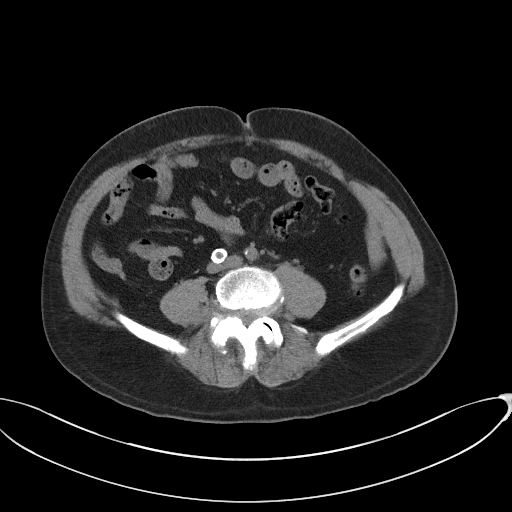
[im 69/110  soft-tissue]
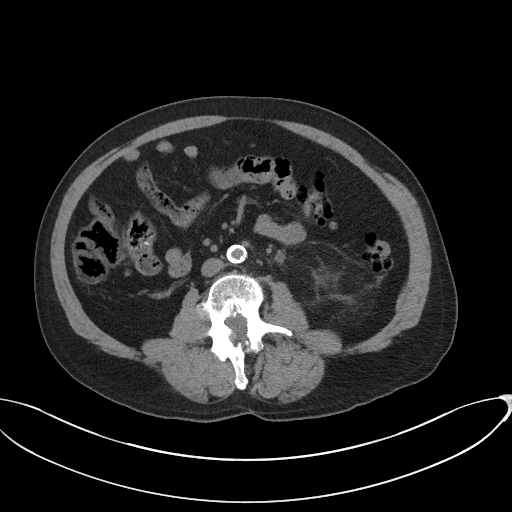
[im 78/110  soft-tissue]
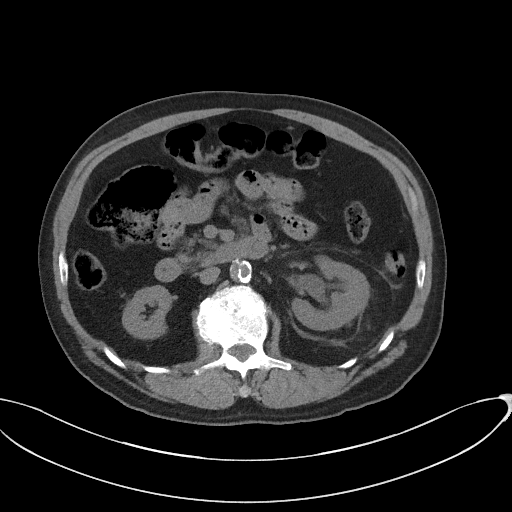
[im 78/110  bone]
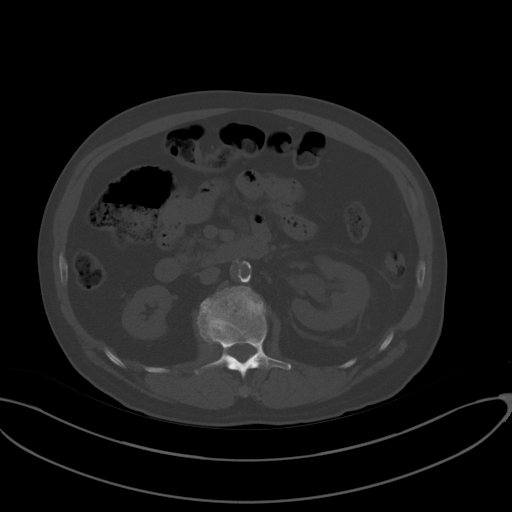
[im 87/110  soft-tissue]
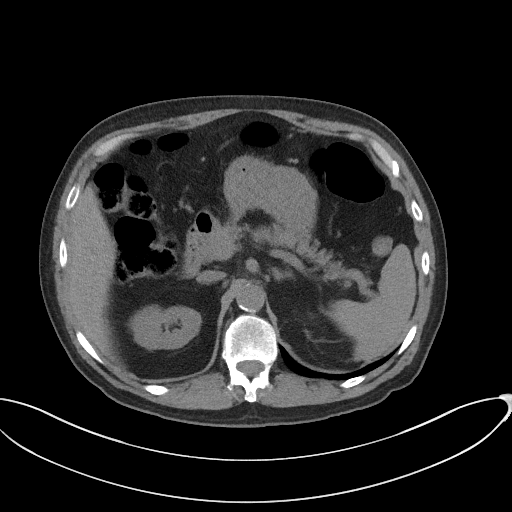
[im 96/110  soft-tissue]
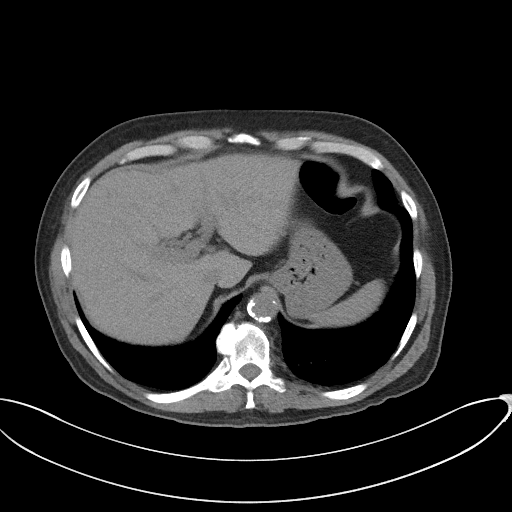
[im 105/110  soft-tissue]
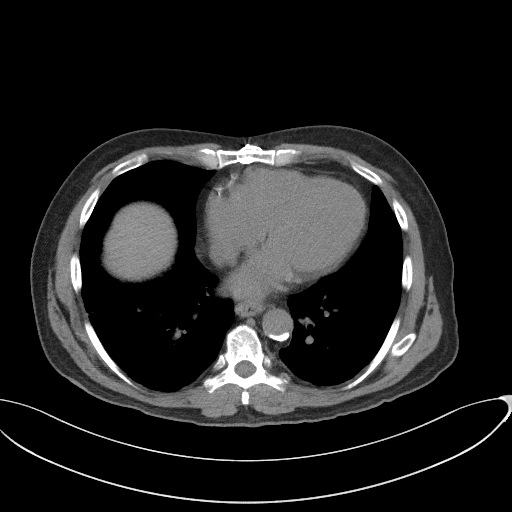

[Series 5: coronal st · coronal · 0.74mm/px · 3 of 93 slices shown]
[im 31/93  soft-tissue]
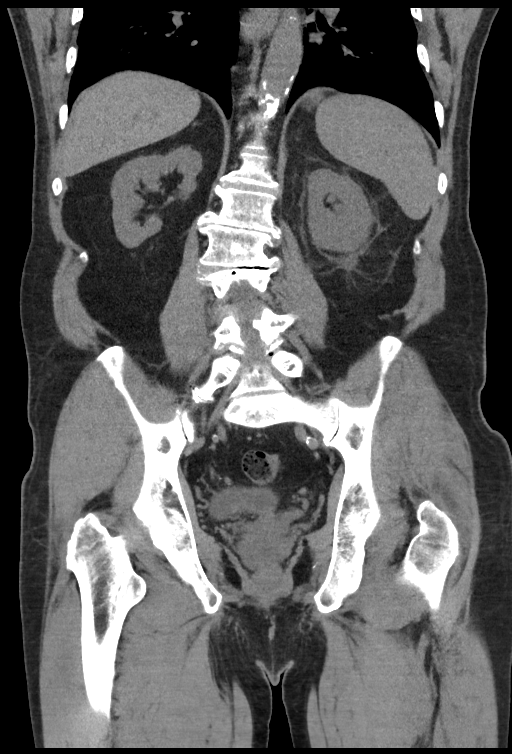
[im 41/93  soft-tissue]
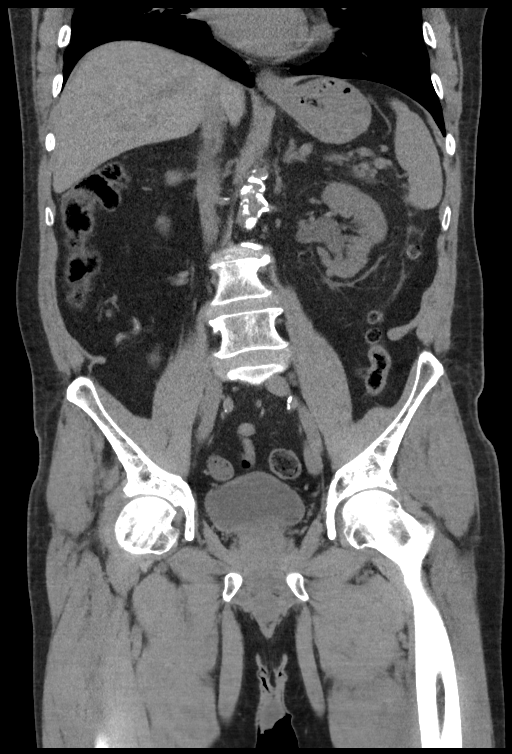
[im 52/93  soft-tissue]
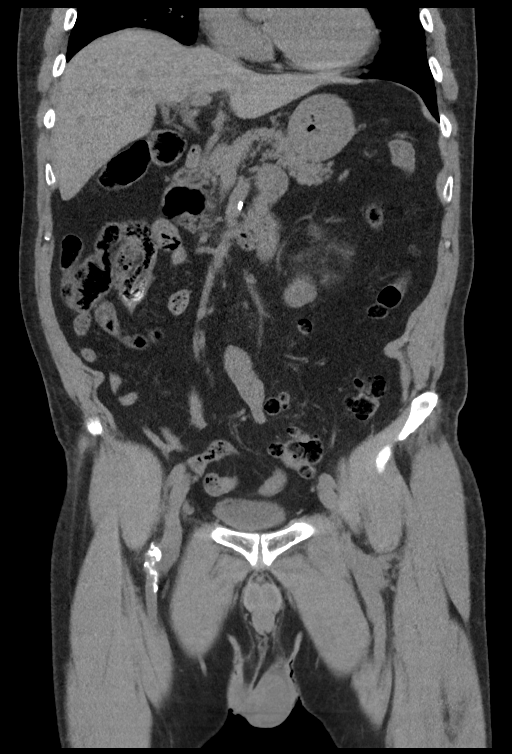

[15 of 46 positions shown; findings below may reference images not displayed]

FINDINGS: Lower chest: Trace LEFT basilar paraseptal emphysematous lung
changes. Median sternotomy, incompletely imaged. Severe burden of
RCA atherosclerosis. Mild gynecomastia.

Hepatobiliary: Normal noncontrast appearance without focal liver
abnormality. No gallstones, gallbladder wall thickening, or biliary
dilatation.

Pancreas: Unremarkable. No pancreatic ductal dilatation or
surrounding inflammatory changes.

Spleen: Normal in size without focal abnormality.

Adrenals/Urinary Tract:

*Adrenal glands are unremarkable.
*RIGHT kidney is normal, without renal calculi, focal lesion, or
hydronephrosis.
*Small-to-moderate volume LEFT hydronephrosis and perinephric
stranding.
*Mild ureteral dilation with transition near the pelvic brim (see
image). No radiodense calculus.
*Bladder is unremarkable.

Stomach/Bowel: Stomach is within normal limits. Inspissated
radiodensity at the base of cecum and proximal appendix, likely
ingested. Appendix appears normal. Moderate sigmoid diverticulosis.
No evidence of bowel wall thickening, distention, or inflammatory
changes.

Vascular/Lymphatic: Severe burden of aortic atherosclerosis, without
aneurysmal dilation. No enlarged abdominal or pelvic lymph nodes.

Reproductive: Prostatomegaly, measuring up to 5.1 cm. Vasectomy.
Small volume hydroceles.

Other: No abdominal wall hernia or abnormality. No abdominopelvic
ascites.

Musculoskeletal: Multilevel degenerative changes of spine,
predominantly at the thoracolumbar junction and mid lumbar spine. No
acute osseous abnormality
IMPRESSION: 1. Small to moderate volume LEFT hydronephrosis and perinephric
stranding.
2. Ureteral dilation without radiodense calculus, suspect
radiolucent nephrolith. Differential diagnosis includes LEFT
pyelonephritis.
Additional chronic and senescent changes, as above.
